# Patient Record
Sex: Male | Born: 1985 | Race: Asian | Hispanic: No | Marital: Married
Health system: Southern US, Community
[De-identification: ages and names within clinical notes are randomized; demographics above are authoritative.]

---

## 2020-11-21 ENCOUNTER — Emergency Department (HOSPITAL_BASED_OUTPATIENT_CLINIC_OR_DEPARTMENT_OTHER)
Admission: EM | Admit: 2020-11-21 | Discharge: 2020-11-21 | Disposition: A | Discharge status: Home / Self Care | Payer: BC Managed Care – PPO | Attending: Emergency Medicine | Admitting: Emergency Medicine

## 2020-11-21 ENCOUNTER — Emergency Department (HOSPITAL_BASED_OUTPATIENT_CLINIC_OR_DEPARTMENT_OTHER): Payer: BC Managed Care – PPO

## 2020-11-21 ENCOUNTER — Other Ambulatory Visit: Payer: Self-pay

## 2020-11-21 ENCOUNTER — Encounter (HOSPITAL_BASED_OUTPATIENT_CLINIC_OR_DEPARTMENT_OTHER): Payer: Self-pay | Admitting: Emergency Medicine

## 2020-11-21 DIAGNOSIS — X500XXA Overexertion from strenuous movement or load, initial encounter: Secondary | ICD-10-CM | POA: Diagnosis not present

## 2020-11-21 DIAGNOSIS — S6992XA Unspecified injury of left wrist, hand and finger(s), initial encounter: Secondary | ICD-10-CM | POA: Diagnosis present

## 2020-11-21 DIAGNOSIS — S63602A Unspecified sprain of left thumb, initial encounter: Secondary | ICD-10-CM | POA: Insufficient documentation

## 2020-11-21 NOTE — ED Triage Notes (Signed)
Emergency Medicine Provider Triage Evaluation Note  Corey Atkins , a 35 y.o. male  was evaluated in triage.  Pt complains of injury to left thumb.  Injury occurred earlier today when he was moving a piece of furniture and his thumb was hyperextended.  Denies any numbness, tingling, weakness or decrease in station to affected finger.  Endorses swelling patient is right-hand dominant.  Review of Systems  Positive: Left thumb pain, left thumb swelling Negative: Numbness, tingling, weakness  Physical Exam  BP (!) 146/98 (BP Location: Left Arm)   Pulse (!) 104   Temp 98.4 F (36.9 C) (Oral)   Ht 5\' 6"  (1.676 m)   Wt 86.2 kg   SpO2 97%   BMI 30.67 kg/m  Gen:   Awake, no distress   HEENT:  Atraumatic  Resp:  Normal effort  Cardiac:  Tachycardic at rate of 104 MSK:   Moves extremities without difficulty  Neuro:  Speech clear   Medical Decision Making  Medically screening exam initiated at 1:57 PM.  Appropriate orders placed.  Brooklyn Pelzer was informed that the remainder of the evaluation will be completed by another provider, this initial triage assessment does not replace that evaluation, and the importance of remaining in the ED until their evaluation is complete.  Clinical Impression   Imaging ordered.  The patient appears stable so that the remainder of the work up may be completed by another provider.      Cyndie Chime, Haskel Schroeder 11/21/20 1359

## 2020-11-21 NOTE — ED Triage Notes (Signed)
Reports left thumb injury when moving a heavy chair. Painful ROM , some swelling noted

## 2020-11-21 NOTE — ED Provider Notes (Signed)
   Emergency Department Provider Note   I have reviewed the triage vital signs and the nursing notes.   HISTORY  Chief Complaint Finger Injury (Left thumb)   HPI Corey Atkins is a 35 y.o. male presents to the ED with left thumb pain after lifting heavy furniture. He was lifting something heavy and his left thumb bent backwards. He has pain at the base of the left thumb but not in the wrist. Some mild swelling noted. No forearm or elbow pain. Pain is moderate and worse with movement. No lacerations. No numbness/tingling. No injury to other fingers.   History reviewed. No pertinent past medical history.  There are no problems to display for this patient.   History reviewed. No pertinent surgical history.  Allergies Patient has no known allergies.  No family history on file.  Social History    Review of Systems  Constitutional: No fever/chills Musculoskeletal: Left thumb pain.  Skin: Negative for rash. Neurological: Negative for numbness.  ____________________________________________   PHYSICAL EXAM:  VITAL SIGNS: ED Triage Vitals  Enc Vitals Group     BP 11/21/20 1236 (!) 146/98     Pulse Rate 11/21/20 1236 (!) 104     Resp --      Temp 11/21/20 1236 98.4 F (36.9 C)     Temp Source 11/21/20 1236 Oral     SpO2 11/21/20 1236 97 %     Weight 11/21/20 1236 190 lb (86.2 kg)     Height 11/21/20 1236 5\' 6"  (1.676 m)   Constitutional: Alert and oriented. Well appearing and in no acute distress. Eyes: Conjunctivae are normal.  Head: Atraumatic. Nose: No congestion/rhinnorhea. Mouth/Throat: Mucous membranes are moist.  Neck: No stridor.  Cardiovascular: Normal rate, regular rhythm. Good peripheral circulation. Respiratory: Normal respiratory effort.  Gastrointestinal: No distention.  Musculoskeletal: Mild tenderness over the thenar eminence. No bruising. Normal flexion/extension strength.  Neurologic:  Normal speech and language. No weakness/numbness of the left  thumb.  Skin:  Skin is warm, dry and intact. No rash noted.  ____________________________________________  RADIOLOGY  DG Finger Thumb Left  Result Date: 11/21/2020 CLINICAL DATA:  Twisted finger at the base of the thumb. EXAM: LEFT THUMB 2+V COMPARISON:  None. FINDINGS: There is no evidence of fracture or dislocation. There is no evidence of arthropathy or other focal bone abnormality. Soft tissues are unremarkable. IMPRESSION: Negative. Electronically Signed   By: 11/23/2020 M.D.   On: 11/21/2020 14:35    ____________________________________________   PROCEDURES  Procedure(s) performed:   Procedures  None ____________________________________________   INITIAL IMPRESSION / ASSESSMENT AND PLAN / ED COURSE  Pertinent labs & imaging results that were available during my care of the patient were reviewed by me and considered in my medical decision making (see chart for details).   Patient with left thumb pain after bending it backwards. Normal plain films. No wrist tenderness. The extremity is neurovascularly intact. No gross deformity. Plan for thumb spice splint for comfort and sports med follow up. Patient will call for an appointment.    ____________________________________________  FINAL CLINICAL IMPRESSION(S) / ED DIAGNOSES  Final diagnoses:  Sprain of left thumb, unspecified site of digit, initial encounter    Note:  This document was prepared using Dragon voice recognition software and may include unintentional dictation errors.  11/23/2020, MD, [facility] Emergency Medicine    Locke Barrell, [facility], MD 11/22/20 [phone]

## 2020-11-21 NOTE — Discharge Instructions (Signed)
You were seen in the ED today with thumb pain. Please follow with your PCP and the sports medicine doctor listed if your thumb pain continues. Use the thumb splint for comfort. Return to the ED with any new or worsening symptoms.

## 2022-08-11 DIAGNOSIS — I10 Essential (primary) hypertension: Secondary | ICD-10-CM | POA: Diagnosis not present

## 2022-08-11 DIAGNOSIS — R7303 Prediabetes: Secondary | ICD-10-CM | POA: Diagnosis not present

## 2022-08-11 DIAGNOSIS — E782 Mixed hyperlipidemia: Secondary | ICD-10-CM | POA: Diagnosis not present

## 2022-08-20 IMAGING — DX DG FINGER THUMB 2+V*L*
3 series · 3 of 3 positions shown · non-contrast
Comparison: None.

CLINICAL DATA: Twisted finger at the base of the thumb.

EXAM:
LEFT THUMB 2+V

[finger ap]
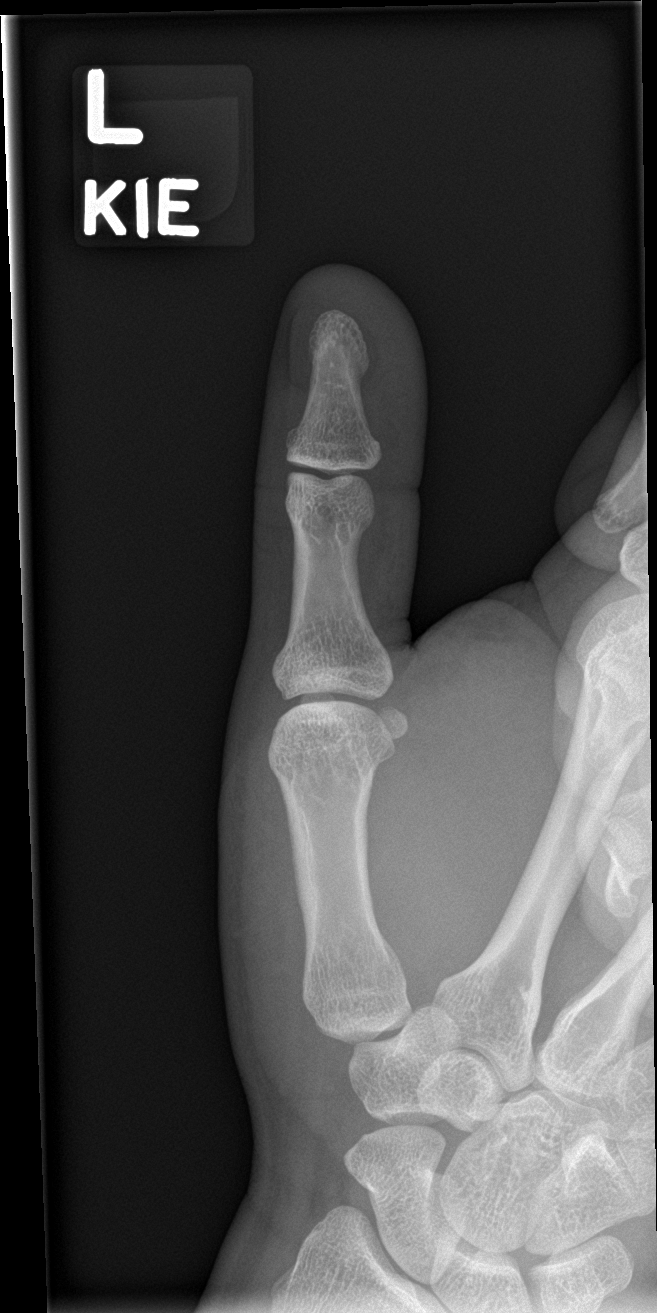

[finger obl]
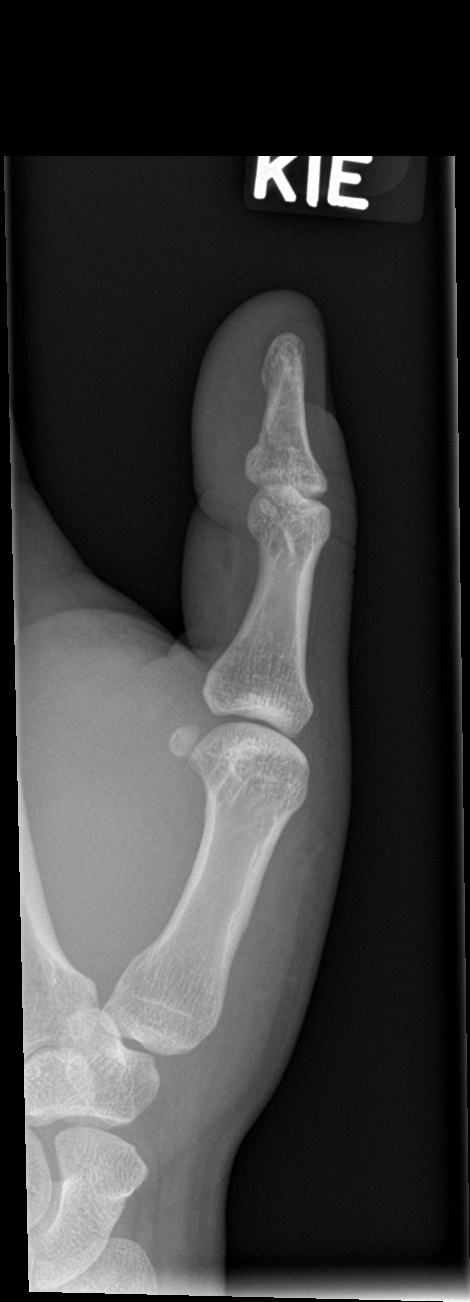

[finger lat]
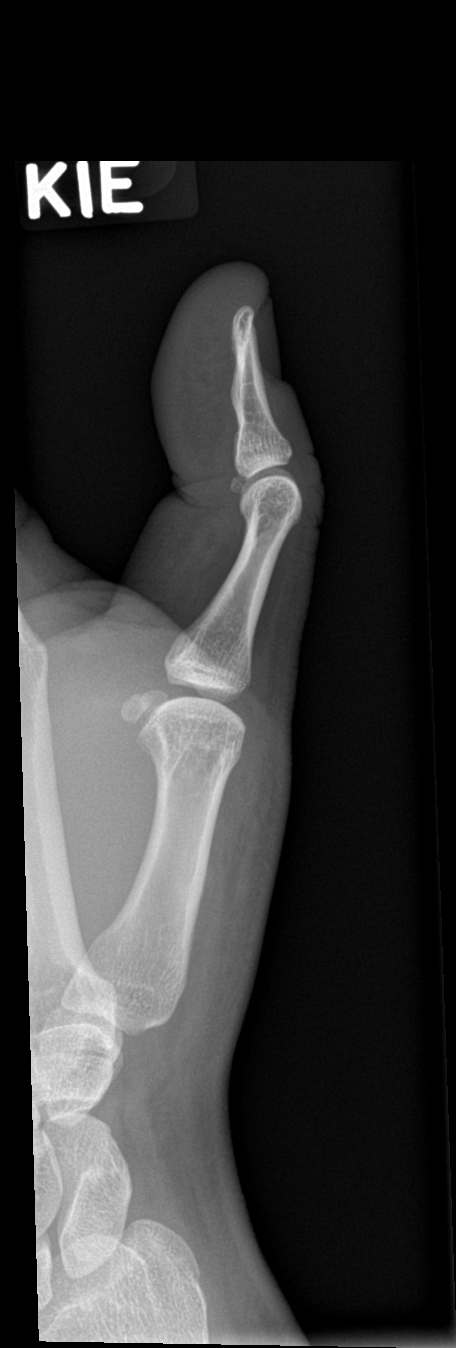

[3 of 3 positions shown; findings below may reference images not displayed]

FINDINGS: There is no evidence of fracture or dislocation. There is no
evidence of arthropathy or other focal bone abnormality. Soft
tissues are unremarkable.
IMPRESSION: Negative.

## 2022-08-24 DIAGNOSIS — R748 Abnormal levels of other serum enzymes: Secondary | ICD-10-CM | POA: Diagnosis not present

## 2022-08-24 DIAGNOSIS — K760 Fatty (change of) liver, not elsewhere classified: Secondary | ICD-10-CM | POA: Diagnosis not present

## 2023-04-24 DIAGNOSIS — I10 Essential (primary) hypertension: Secondary | ICD-10-CM | POA: Diagnosis not present

## 2023-04-24 DIAGNOSIS — R7303 Prediabetes: Secondary | ICD-10-CM | POA: Diagnosis not present

## 2023-04-24 DIAGNOSIS — Z6830 Body mass index (BMI) 30.0-30.9, adult: Secondary | ICD-10-CM | POA: Diagnosis not present

## 2023-04-24 DIAGNOSIS — K760 Fatty (change of) liver, not elsewhere classified: Secondary | ICD-10-CM | POA: Diagnosis not present

## 2023-04-24 DIAGNOSIS — E782 Mixed hyperlipidemia: Secondary | ICD-10-CM | POA: Diagnosis not present

## 2023-04-24 DIAGNOSIS — Z0000 Encounter for general adult medical examination without abnormal findings: Secondary | ICD-10-CM | POA: Diagnosis not present

## 2024-01-08 DIAGNOSIS — R7303 Prediabetes: Secondary | ICD-10-CM | POA: Diagnosis not present

## 2024-02-07 DIAGNOSIS — R7303 Prediabetes: Secondary | ICD-10-CM | POA: Diagnosis not present

## 2024-03-09 DIAGNOSIS — R7303 Prediabetes: Secondary | ICD-10-CM | POA: Diagnosis not present
